# Patient Record
Sex: Female | Born: 1960 | Race: White | Hispanic: No | Smoking: Never smoker
Health system: Southern US, Community
[De-identification: ages and names within clinical notes are randomized; demographics above are authoritative.]

## PROBLEM LIST (undated history)

## (undated) DIAGNOSIS — I10 Essential (primary) hypertension: Secondary | ICD-10-CM

## (undated) HISTORY — DX: Essential (primary) hypertension: I10

---

## 1998-04-11 ENCOUNTER — Other Ambulatory Visit: Admission: RE | Admit: 1998-04-11 | Discharge: 1998-04-11 | Payer: Self-pay | Admitting: Obstetrics and Gynecology

## 2000-04-23 ENCOUNTER — Encounter: Payer: Self-pay | Admitting: Family Medicine

## 2000-04-23 ENCOUNTER — Encounter: Admission: RE | Admit: 2000-04-23 | Discharge: 2000-04-23 | Payer: Self-pay | Admitting: Family Medicine

## 2001-10-14 ENCOUNTER — Encounter: Payer: Self-pay | Admitting: Family Medicine

## 2001-10-14 ENCOUNTER — Encounter: Admission: RE | Admit: 2001-10-14 | Discharge: 2001-10-14 | Payer: Self-pay | Admitting: Family Medicine

## 2002-11-01 ENCOUNTER — Other Ambulatory Visit: Admission: RE | Admit: 2002-11-01 | Discharge: 2002-11-01 | Payer: Self-pay | Admitting: *Deleted

## 2003-08-19 ENCOUNTER — Emergency Department (HOSPITAL_COMMUNITY): Admission: EM | Admit: 2003-08-19 | Discharge: 2003-08-19 | Payer: Self-pay | Admitting: Emergency Medicine

## 2009-11-05 ENCOUNTER — Other Ambulatory Visit: Admission: RE | Admit: 2009-11-05 | Discharge: 2009-11-05 | Payer: Self-pay | Admitting: Family Medicine

## 2010-02-11 ENCOUNTER — Ambulatory Visit (HOSPITAL_COMMUNITY)
Admission: RE | Admit: 2010-02-11 | Discharge: 2010-02-11 | Payer: Self-pay | Source: Home / Self Care | Attending: Obstetrics and Gynecology | Admitting: Obstetrics and Gynecology

## 2010-02-18 LAB — CBC
HCT: 28.4 % — ABNORMAL LOW (ref 36.0–46.0)
Hemoglobin: 8.7 g/dL — ABNORMAL LOW (ref 12.0–15.0)
MCH: 23.8 pg — ABNORMAL LOW (ref 26.0–34.0)
MCHC: 30.6 g/dL (ref 30.0–36.0)
MCV: 77.6 fL — ABNORMAL LOW (ref 78.0–100.0)
Platelets: 289 10*3/uL (ref 150–400)
RBC: 3.66 MIL/uL — ABNORMAL LOW (ref 3.87–5.11)
RDW: 14.5 % (ref 11.5–15.5)
WBC: 6.5 10*3/uL (ref 4.0–10.5)

## 2010-12-12 ENCOUNTER — Other Ambulatory Visit: Payer: Self-pay | Admitting: Radiology

## 2010-12-16 ENCOUNTER — Other Ambulatory Visit: Payer: Self-pay | Admitting: Obstetrics and Gynecology

## 2010-12-16 ENCOUNTER — Other Ambulatory Visit (HOSPITAL_COMMUNITY)
Admission: RE | Admit: 2010-12-16 | Discharge: 2010-12-16 | Disposition: A | Discharge status: Home / Self Care | Payer: BC Managed Care – PPO | Source: Ambulatory Visit | Attending: Obstetrics and Gynecology | Admitting: Obstetrics and Gynecology

## 2010-12-16 DIAGNOSIS — Z01419 Encounter for gynecological examination (general) (routine) without abnormal findings: Secondary | ICD-10-CM | POA: Insufficient documentation

## 2014-05-26 ENCOUNTER — Other Ambulatory Visit (HOSPITAL_COMMUNITY)
Admission: RE | Admit: 2014-05-26 | Discharge: 2014-05-26 | Disposition: A | Discharge status: Home / Self Care | Payer: 59 | Source: Ambulatory Visit | Attending: Family Medicine | Admitting: Family Medicine

## 2014-05-26 DIAGNOSIS — Z124 Encounter for screening for malignant neoplasm of cervix: Secondary | ICD-10-CM | POA: Insufficient documentation

## 2014-05-31 ENCOUNTER — Other Ambulatory Visit: Payer: Self-pay | Admitting: Family Medicine

## 2014-06-01 LAB — CYTOLOGY - PAP

## 2015-06-01 ENCOUNTER — Encounter: Payer: Self-pay | Admitting: Podiatry

## 2015-06-01 ENCOUNTER — Ambulatory Visit (INDEPENDENT_AMBULATORY_CARE_PROVIDER_SITE_OTHER): Payer: 59 | Admitting: Podiatry

## 2015-06-01 VITALS — BP 121/80 | HR 76 | Resp 12

## 2015-06-01 DIAGNOSIS — L600 Ingrowing nail: Secondary | ICD-10-CM | POA: Diagnosis not present

## 2015-06-01 NOTE — Progress Notes (Signed)
   Subjective:    Patient ID: Donna Pollard, female    DOB: [birth year], 55 y.o.   MRN: [id number]  HPI  Chief Complaint  Patient presents with  . Ingrown Toenail    ''rt foot great toenail is sore.''  pt stated rt foot medial side of the great toenail is been hurting for about 4 months. Toenail is getting worse especially when putting pressure on it. Tried no treatment.  Review of Systems  Skin: Positive for color change.       Objective:   Physical Exam        Assessment & Plan:

## 2015-06-01 NOTE — Patient Instructions (Addendum)

## 2015-06-04 NOTE — Progress Notes (Signed)
Subjective:     Patient ID: Donna Pollard, female   DOB: 1960-10-30, 55 y.o.   MRN: [id number]  HPI patient presents with a painful ingrown toenail the right big toe medial border that makes it hard to wear shoe gear comfortably and stated she did have some previous drainage and redness   Review of Systems  All other systems reviewed and are negative.      Objective:   Physical Exam  Constitutional: She is oriented to person, place, and time.  Cardiovascular: Intact distal pulses.   Musculoskeletal: Normal range of motion.  Neurological: She is oriented to person, place, and time.  Skin: Skin is warm.  Nursing note and vitals reviewed.  neurovascular status intact muscle strength adequate range of motion within normal limits with patient found to have incurvated medial border right hallux that's red and painful when palpated. Patient states that it makes it hard to wear shoe gear comfortably     Assessment:     Ingrown toenail deformity right hallux medial border with pain    Plan:     H&P conditions reviewed and discussed treatment options. Due to long-standing nature permanent correction with surgical procedure is been recommended and she wants this understanding risk. I went ahead today and I infiltrated the right hallux 60 mg Xylocaine Marcaine mixture remove the medial border exposed matrix chemical phenol 3 applications 30 seconds followed by alcohol lavage and sterile dressing. Gave instructions on soaks and reappoint

## 2015-06-13 ENCOUNTER — Telehealth: Payer: Self-pay | Admitting: *Deleted

## 2015-06-13 NOTE — Telephone Encounter (Signed)
Called patient at [phone] (Home #) to check to see how they were doing from their ingrown toenail procedure that was performed on Friday, June 01, 2015. Pt stated, "She is feeling fine, but has yellow-green colored ;pus coming out of toe". I told patient that I wanted her to come in to see Dr. Paulla Dolly to have toe checked. I transferred pt over to [facility] in scheduling to see if pt could come in tomorrow (06/14/15) to be seen.

## 2015-06-14 ENCOUNTER — Ambulatory Visit (INDEPENDENT_AMBULATORY_CARE_PROVIDER_SITE_OTHER): Payer: 59 | Admitting: Podiatry

## 2015-06-14 ENCOUNTER — Encounter: Payer: Self-pay | Admitting: Podiatry

## 2015-06-14 DIAGNOSIS — L600 Ingrowing nail: Secondary | ICD-10-CM

## 2015-06-14 NOTE — Progress Notes (Signed)
Subjective:     Patient ID: Donna Pollard, female   DOB: 1961/01/29, 55 y.o.   MRN: [id number]  HPI patient presents stating I just wanted to get this toenail checked   Review of Systems     Objective:   Physical Exam Neurovascular status intact with well-healed surgical site right hallux with slight bit of drainage but localized with no erythema edema or minimal pain    Assessment:     Doing well from ingrown toenail removal right    Plan:     Explained is normal to have a slight bit of irritation and low grade redness and it should go away gradually and if he gets worse we will start her on an antibiotic

## 2016-03-14 DIAGNOSIS — J069 Acute upper respiratory infection, unspecified: Secondary | ICD-10-CM | POA: Diagnosis not present

## 2016-04-03 DIAGNOSIS — H6983 Other specified disorders of Eustachian tube, bilateral: Secondary | ICD-10-CM | POA: Diagnosis not present

## 2016-04-24 ENCOUNTER — Other Ambulatory Visit: Payer: Self-pay | Admitting: Family Medicine

## 2016-04-24 DIAGNOSIS — Z1231 Encounter for screening mammogram for malignant neoplasm of breast: Secondary | ICD-10-CM

## 2016-05-22 ENCOUNTER — Ambulatory Visit
Admission: RE | Admit: 2016-05-22 | Discharge: 2016-05-22 | Disposition: A | Discharge status: Home / Self Care | Payer: 59 | Source: Ambulatory Visit | Attending: Family Medicine | Admitting: Family Medicine

## 2016-05-22 DIAGNOSIS — Z1231 Encounter for screening mammogram for malignant neoplasm of breast: Secondary | ICD-10-CM

## 2016-07-03 DIAGNOSIS — Z0000 Encounter for general adult medical examination without abnormal findings: Secondary | ICD-10-CM | POA: Diagnosis not present

## 2016-07-03 DIAGNOSIS — R7303 Prediabetes: Secondary | ICD-10-CM | POA: Diagnosis not present

## 2016-07-03 DIAGNOSIS — E7800 Pure hypercholesterolemia, unspecified: Secondary | ICD-10-CM | POA: Diagnosis not present

## 2016-07-03 DIAGNOSIS — I10 Essential (primary) hypertension: Secondary | ICD-10-CM | POA: Diagnosis not present

## 2016-12-29 DIAGNOSIS — E7800 Pure hypercholesterolemia, unspecified: Secondary | ICD-10-CM | POA: Diagnosis not present

## 2016-12-29 DIAGNOSIS — Z23 Encounter for immunization: Secondary | ICD-10-CM | POA: Diagnosis not present

## 2016-12-29 DIAGNOSIS — R7303 Prediabetes: Secondary | ICD-10-CM | POA: Diagnosis not present

## 2016-12-29 DIAGNOSIS — I10 Essential (primary) hypertension: Secondary | ICD-10-CM | POA: Diagnosis not present

## 2017-04-17 DIAGNOSIS — M62830 Muscle spasm of back: Secondary | ICD-10-CM | POA: Diagnosis not present

## 2017-04-28 DIAGNOSIS — L718 Other rosacea: Secondary | ICD-10-CM | POA: Diagnosis not present

## 2017-04-28 DIAGNOSIS — L570 Actinic keratosis: Secondary | ICD-10-CM | POA: Diagnosis not present

## 2017-04-28 DIAGNOSIS — I788 Other diseases of capillaries: Secondary | ICD-10-CM | POA: Diagnosis not present

## 2017-05-04 DIAGNOSIS — J029 Acute pharyngitis, unspecified: Secondary | ICD-10-CM | POA: Diagnosis not present

## 2017-05-08 DIAGNOSIS — Z719 Counseling, unspecified: Secondary | ICD-10-CM | POA: Diagnosis not present

## 2017-05-20 DIAGNOSIS — Z719 Counseling, unspecified: Secondary | ICD-10-CM | POA: Diagnosis not present

## 2017-05-24 DIAGNOSIS — N390 Urinary tract infection, site not specified: Secondary | ICD-10-CM | POA: Diagnosis not present

## 2017-05-26 DIAGNOSIS — N390 Urinary tract infection, site not specified: Secondary | ICD-10-CM | POA: Diagnosis not present

## 2017-06-03 DIAGNOSIS — Z719 Counseling, unspecified: Secondary | ICD-10-CM | POA: Diagnosis not present

## 2017-06-04 DIAGNOSIS — R399 Unspecified symptoms and signs involving the genitourinary system: Secondary | ICD-10-CM | POA: Diagnosis not present

## 2017-06-10 DIAGNOSIS — Z719 Counseling, unspecified: Secondary | ICD-10-CM | POA: Diagnosis not present

## 2017-07-16 DIAGNOSIS — T17208A Unspecified foreign body in pharynx causing other injury, initial encounter: Secondary | ICD-10-CM | POA: Diagnosis not present

## 2017-11-20 DIAGNOSIS — Z23 Encounter for immunization: Secondary | ICD-10-CM | POA: Diagnosis not present

## 2017-12-10 DIAGNOSIS — D171 Benign lipomatous neoplasm of skin and subcutaneous tissue of trunk: Secondary | ICD-10-CM | POA: Diagnosis not present

## 2017-12-10 DIAGNOSIS — L814 Other melanin hyperpigmentation: Secondary | ICD-10-CM | POA: Diagnosis not present

## 2017-12-10 DIAGNOSIS — D225 Melanocytic nevi of trunk: Secondary | ICD-10-CM | POA: Diagnosis not present

## 2017-12-30 ENCOUNTER — Other Ambulatory Visit (HOSPITAL_COMMUNITY)
Admission: RE | Admit: 2017-12-30 | Discharge: 2017-12-30 | Disposition: A | Discharge status: Home / Self Care | Payer: 59 | Source: Ambulatory Visit | Attending: Family Medicine | Admitting: Family Medicine

## 2017-12-30 ENCOUNTER — Other Ambulatory Visit: Payer: Self-pay | Admitting: Family Medicine

## 2017-12-30 DIAGNOSIS — Z0000 Encounter for general adult medical examination without abnormal findings: Secondary | ICD-10-CM | POA: Diagnosis not present

## 2017-12-30 DIAGNOSIS — Z01411 Encounter for gynecological examination (general) (routine) with abnormal findings: Secondary | ICD-10-CM | POA: Diagnosis present

## 2017-12-30 DIAGNOSIS — I10 Essential (primary) hypertension: Secondary | ICD-10-CM | POA: Diagnosis not present

## 2017-12-30 DIAGNOSIS — E7800 Pure hypercholesterolemia, unspecified: Secondary | ICD-10-CM | POA: Diagnosis not present

## 2017-12-30 DIAGNOSIS — R7303 Prediabetes: Secondary | ICD-10-CM | POA: Diagnosis not present

## 2018-01-04 LAB — CYTOLOGY - PAP: Diagnosis: NEGATIVE

## 2018-02-12 DIAGNOSIS — L821 Other seborrheic keratosis: Secondary | ICD-10-CM | POA: Diagnosis not present

## 2018-02-12 DIAGNOSIS — L245 Irritant contact dermatitis due to other chemical products: Secondary | ICD-10-CM | POA: Diagnosis not present

## 2019-01-09 IMAGING — MG 2D DIGITAL SCREENING BILATERAL MAMMOGRAM WITH CAD AND ADJUNCT TO
8 of 12 series · 8 of 28 positions shown · non-contrast
Comparison: Previous exam(s).

CLINICAL DATA: Screening.

EXAM:
2D DIGITAL SCREENING BILATERAL MAMMOGRAM WITH CAD AND ADJUNCT TOMO

[L MLO synth-2D]
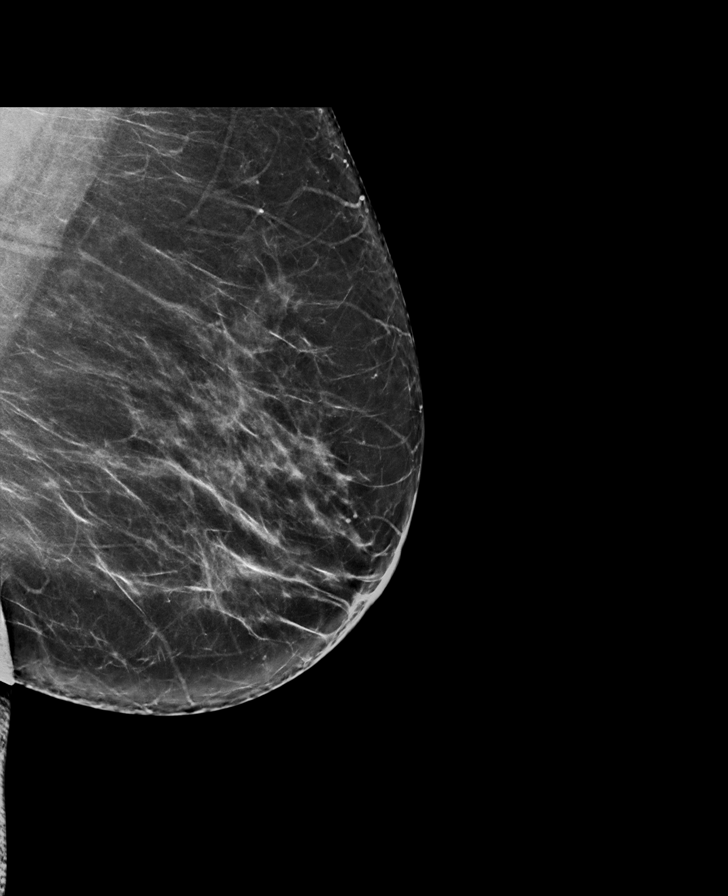

[L CC synth-2D]
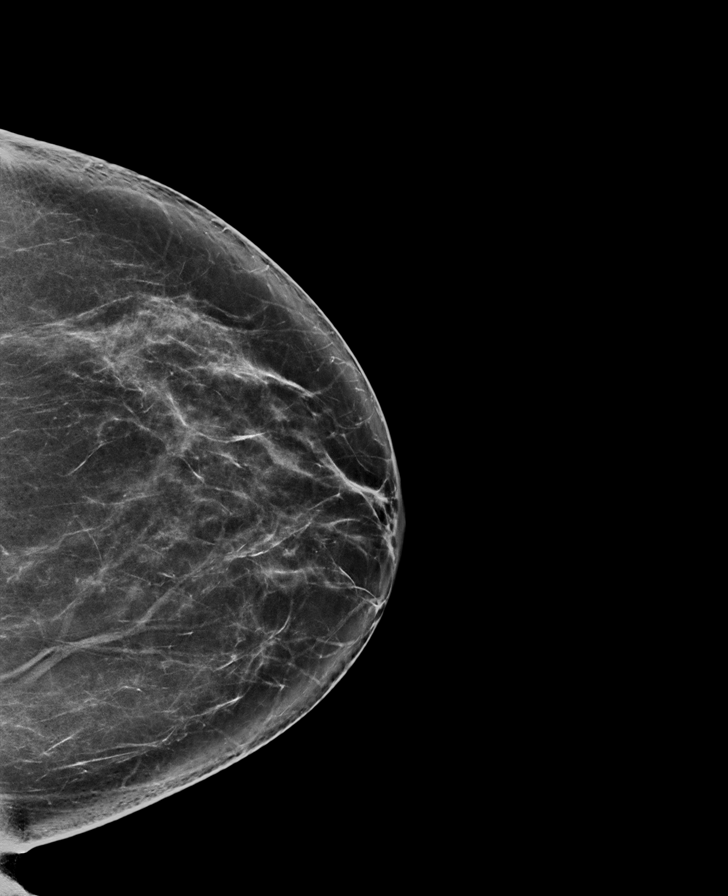

[R MLO synth-2D]
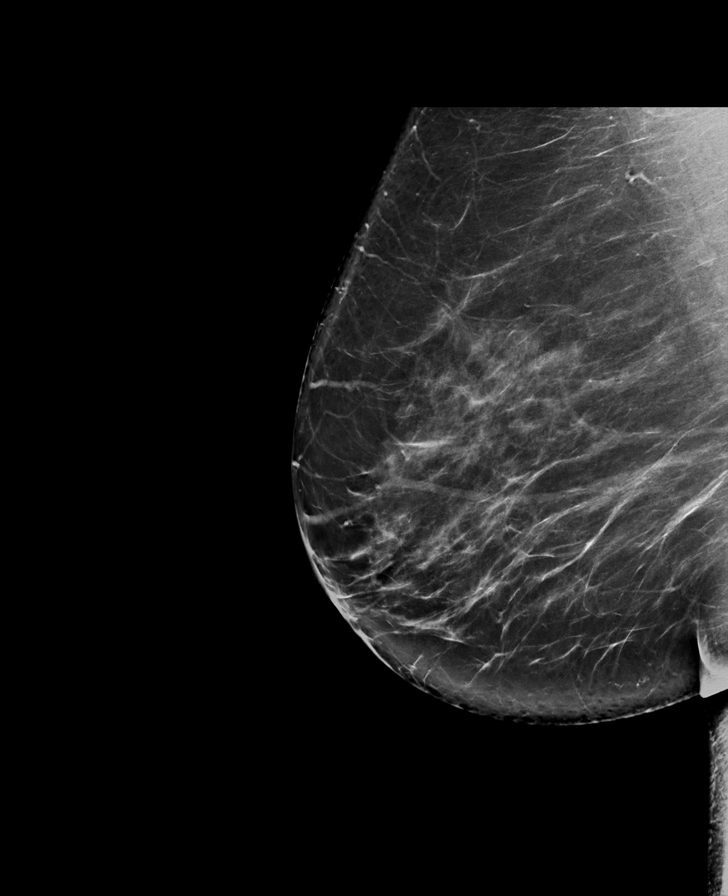

[L CC]
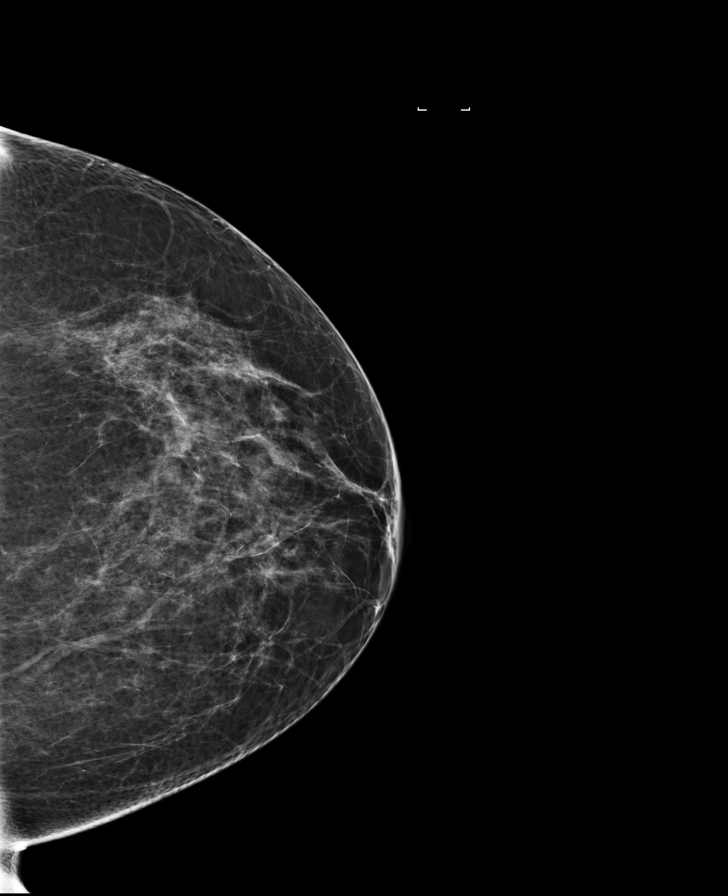

[R CC synth-2D]
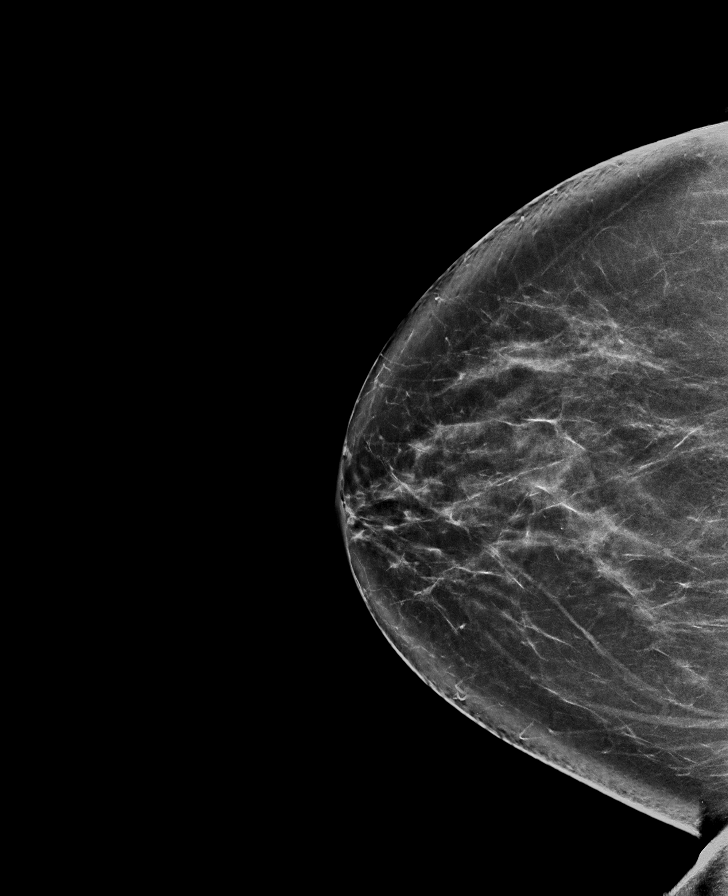

[R CC]
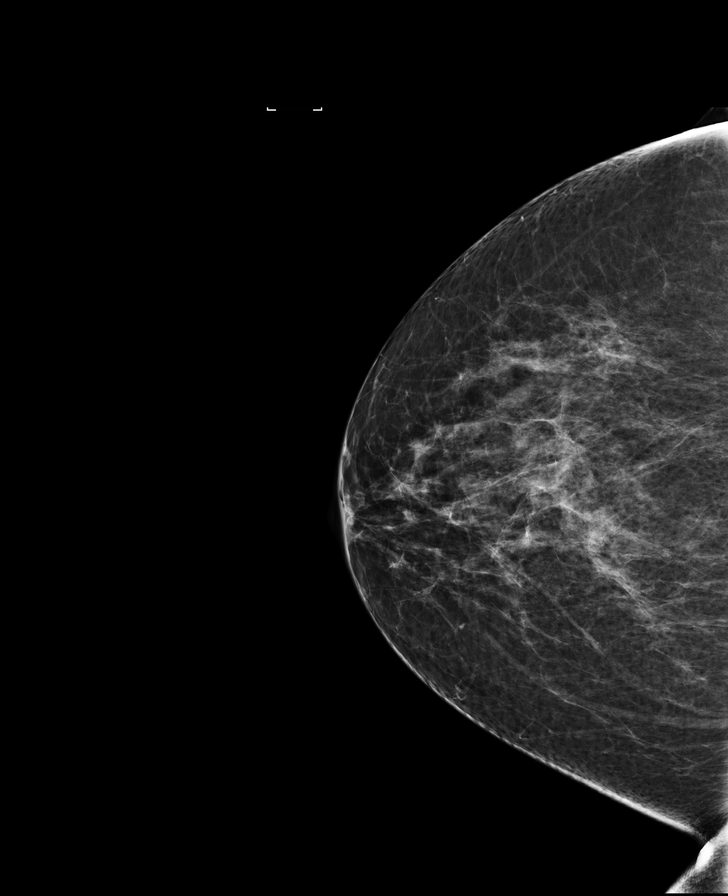

[R MLO]
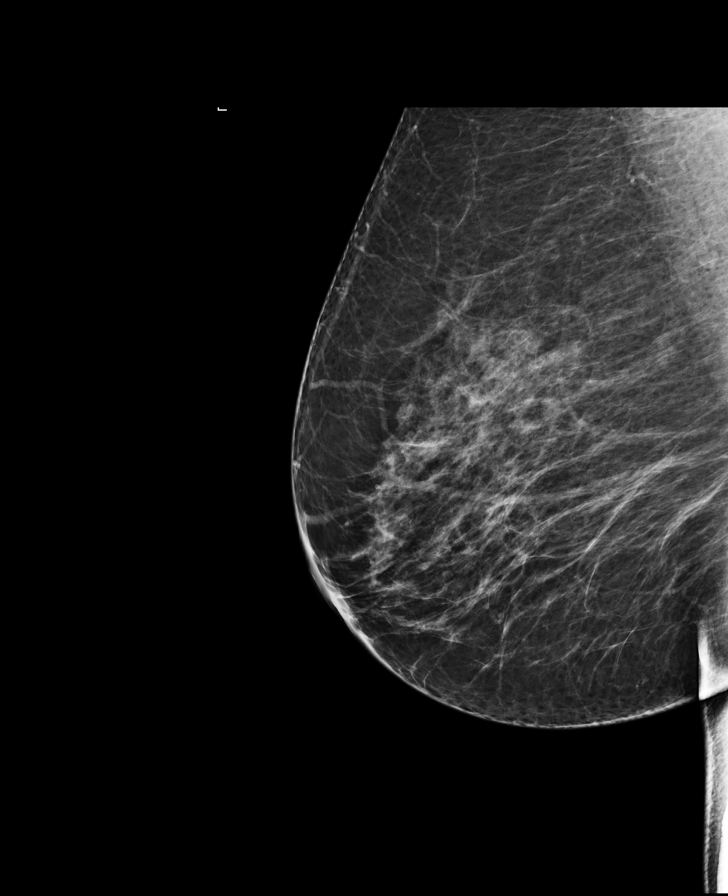

[L MLO]
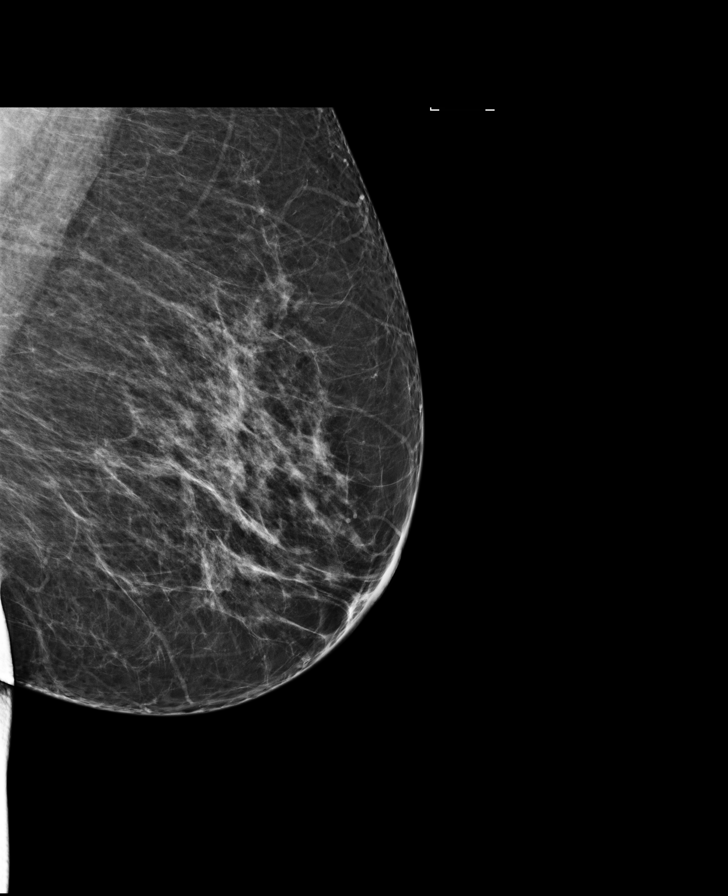

[8 of 28 positions shown; findings below may reference images not displayed]

ACR Breast Density Category b: There are scattered areas of
fibroglandular density.
FINDINGS: There are no findings suspicious for malignancy. Images were
processed with CAD.
IMPRESSION: No mammographic evidence of malignancy. A result letter of this
screening mammogram will be mailed directly to the patient.

RECOMMENDATION:
Screening mammogram in one year. (Code:97-6-RS4)

BI-RADS CATEGORY  1: Negative.

## 2020-10-05 DIAGNOSIS — R202 Paresthesia of skin: Secondary | ICD-10-CM | POA: Diagnosis not present

## 2020-10-22 DIAGNOSIS — D2111 Benign neoplasm of connective and other soft tissue of right upper limb, including shoulder: Secondary | ICD-10-CM | POA: Diagnosis not present

## 2020-10-22 DIAGNOSIS — L821 Other seborrheic keratosis: Secondary | ICD-10-CM | POA: Diagnosis not present

## 2020-10-22 DIAGNOSIS — L718 Other rosacea: Secondary | ICD-10-CM | POA: Diagnosis not present

## 2020-10-28 DIAGNOSIS — G4733 Obstructive sleep apnea (adult) (pediatric): Secondary | ICD-10-CM | POA: Diagnosis not present

## 2020-11-27 DIAGNOSIS — G4733 Obstructive sleep apnea (adult) (pediatric): Secondary | ICD-10-CM | POA: Diagnosis not present

## 2020-12-28 DIAGNOSIS — G4733 Obstructive sleep apnea (adult) (pediatric): Secondary | ICD-10-CM | POA: Diagnosis not present

## 2021-01-27 DIAGNOSIS — G4733 Obstructive sleep apnea (adult) (pediatric): Secondary | ICD-10-CM | POA: Diagnosis not present

## 2021-02-27 DIAGNOSIS — G4733 Obstructive sleep apnea (adult) (pediatric): Secondary | ICD-10-CM | POA: Diagnosis not present

## 2021-03-30 DIAGNOSIS — G4733 Obstructive sleep apnea (adult) (pediatric): Secondary | ICD-10-CM | POA: Diagnosis not present

## 2021-09-11 ENCOUNTER — Other Ambulatory Visit: Payer: Self-pay | Admitting: Family Medicine

## 2021-09-11 ENCOUNTER — Other Ambulatory Visit (HOSPITAL_COMMUNITY)
Admission: RE | Admit: 2021-09-11 | Discharge: 2021-09-11 | Disposition: A | Discharge status: Home / Self Care | Payer: 59 | Source: Ambulatory Visit | Attending: Family Medicine | Admitting: Family Medicine

## 2021-09-11 DIAGNOSIS — Z0000 Encounter for general adult medical examination without abnormal findings: Secondary | ICD-10-CM | POA: Diagnosis not present

## 2021-09-11 DIAGNOSIS — Z01411 Encounter for gynecological examination (general) (routine) with abnormal findings: Secondary | ICD-10-CM | POA: Insufficient documentation

## 2021-09-13 DIAGNOSIS — I10 Essential (primary) hypertension: Secondary | ICD-10-CM | POA: Diagnosis not present

## 2021-09-13 DIAGNOSIS — E7800 Pure hypercholesterolemia, unspecified: Secondary | ICD-10-CM | POA: Diagnosis not present

## 2021-09-13 DIAGNOSIS — R7303 Prediabetes: Secondary | ICD-10-CM | POA: Diagnosis not present

## 2021-09-13 DIAGNOSIS — Z0000 Encounter for general adult medical examination without abnormal findings: Secondary | ICD-10-CM | POA: Diagnosis not present

## 2021-09-18 LAB — CYTOLOGY - PAP
Adequacy: ABSENT
Comment: NEGATIVE
High risk HPV: NEGATIVE

## 2022-11-13 ENCOUNTER — Other Ambulatory Visit: Payer: Self-pay | Admitting: Nurse Practitioner

## 2022-11-13 ENCOUNTER — Other Ambulatory Visit: Payer: Self-pay | Admitting: Family Medicine

## 2022-11-13 ENCOUNTER — Other Ambulatory Visit (HOSPITAL_COMMUNITY)
Admission: RE | Admit: 2022-11-13 | Discharge: 2022-11-13 | Disposition: A | Discharge status: Home / Self Care | Payer: BC Managed Care – PPO | Source: Ambulatory Visit | Attending: Nurse Practitioner | Admitting: Nurse Practitioner

## 2022-11-13 DIAGNOSIS — Z1231 Encounter for screening mammogram for malignant neoplasm of breast: Secondary | ICD-10-CM

## 2022-11-13 DIAGNOSIS — Z124 Encounter for screening for malignant neoplasm of cervix: Secondary | ICD-10-CM | POA: Diagnosis present

## 2022-11-17 LAB — CYTOLOGY - PAP
Comment: NEGATIVE
High risk HPV: NEGATIVE

## 2022-11-27 ENCOUNTER — Encounter: Payer: Self-pay | Admitting: Radiology

## 2022-11-27 ENCOUNTER — Ambulatory Visit
Admission: RE | Admit: 2022-11-27 | Discharge: 2022-11-27 | Disposition: A | Discharge status: Home / Self Care | Payer: Self-pay | Source: Ambulatory Visit | Attending: Family Medicine | Admitting: Family Medicine

## 2022-11-27 DIAGNOSIS — Z1231 Encounter for screening mammogram for malignant neoplasm of breast: Secondary | ICD-10-CM

## 2023-09-11 DIAGNOSIS — R0602 Shortness of breath: Secondary | ICD-10-CM | POA: Diagnosis not present

## 2023-09-11 DIAGNOSIS — E7800 Pure hypercholesterolemia, unspecified: Secondary | ICD-10-CM | POA: Diagnosis not present

## 2023-09-15 ENCOUNTER — Other Ambulatory Visit (HOSPITAL_BASED_OUTPATIENT_CLINIC_OR_DEPARTMENT_OTHER): Payer: Self-pay | Admitting: Family Medicine

## 2023-09-15 DIAGNOSIS — E7800 Pure hypercholesterolemia, unspecified: Secondary | ICD-10-CM

## 2023-09-21 ENCOUNTER — Other Ambulatory Visit (HOSPITAL_COMMUNITY): Payer: Self-pay | Admitting: Family Medicine

## 2023-09-21 DIAGNOSIS — R0602 Shortness of breath: Secondary | ICD-10-CM

## 2023-09-24 DIAGNOSIS — Z23 Encounter for immunization: Secondary | ICD-10-CM | POA: Diagnosis not present

## 2023-09-24 DIAGNOSIS — R7303 Prediabetes: Secondary | ICD-10-CM | POA: Diagnosis not present

## 2023-09-24 DIAGNOSIS — Z0000 Encounter for general adult medical examination without abnormal findings: Secondary | ICD-10-CM | POA: Diagnosis not present

## 2023-09-24 DIAGNOSIS — E7800 Pure hypercholesterolemia, unspecified: Secondary | ICD-10-CM | POA: Diagnosis not present

## 2023-10-26 ENCOUNTER — Ambulatory Visit (HOSPITAL_BASED_OUTPATIENT_CLINIC_OR_DEPARTMENT_OTHER)
Admission: RE | Admit: 2023-10-26 | Discharge: 2023-10-26 | Disposition: A | Discharge status: Home / Self Care | Payer: Self-pay | Source: Ambulatory Visit | Attending: Family Medicine | Admitting: Family Medicine

## 2023-10-26 ENCOUNTER — Ambulatory Visit (HOSPITAL_COMMUNITY)
Admission: RE | Admit: 2023-10-26 | Discharge: 2023-10-26 | Disposition: A | Discharge status: Home / Self Care | Payer: Self-pay | Source: Ambulatory Visit | Attending: Family Medicine | Admitting: Family Medicine

## 2023-10-26 DIAGNOSIS — R0602 Shortness of breath: Secondary | ICD-10-CM | POA: Diagnosis not present

## 2023-10-26 DIAGNOSIS — E7800 Pure hypercholesterolemia, unspecified: Secondary | ICD-10-CM | POA: Insufficient documentation

## 2023-10-26 LAB — ECHOCARDIOGRAM COMPLETE
AR max vel: 1.8 cm2
AV Area VTI: 1.83 cm2
AV Area mean vel: 1.8 cm2
AV Mean grad: 4 mmHg
AV Peak grad: 6.9 mmHg
Ao pk vel: 1.31 m/s
Area-P 1/2: 3.12 cm2
MV M vel: 2.2 m/s
MV Peak grad: 19.4 mmHg
S' Lateral: 2.55 cm

## 2023-12-21 NOTE — Progress Notes (Unsigned)
 Cardiology Office Note:    Date:  12/22/2023   ID:  Donna Pollard, DOB 30-Nov-1960, MRN [id number]  PCP:  Regino Slater, MD  Cardiologist:  None  Electrophysiologist:  None   Referring MD: Regino Slater, MD   Chief Complaint  Patient presents with   Shortness of Breath    History of Present Illness:    Donna Pollard is a 63 y.o. female with a hx of hypertension who is referred by Dr. Regino for evaluation of dyspnea.  Echocardiogram 10/26/2023 showed EF 50 to 55%, normal RV function, no significant valvular disease.  Calcium score 640 (97th percentile) on 10/2023.  She reports she had episode of dyspnea while traveling over the summer.  She was at high elevation.  Started having dyspnea and coughing.  Episode last about 30 minutes.  Denies any chest pain.  Does report since that time she been having shortness of breath when she goes on walks.  Denies any exertional chest pain.  She denies any lightheadedness, syncope, lower extremity edema.  Reports rare palpitations.  No smoking history.  Family's includes father had CABG in her late 62s and paternal uncle died of MI at 38.    Past Medical History:  Diagnosis Date   Hypertension     No past surgical history on file.  Current Medications: Current Meds  Medication Sig   aspirin EC 81 MG tablet Take 1 tablet (81 mg total) by mouth daily. Swallow whole.   atenolol (TENORMIN) 50 MG tablet Take 50 mg by mouth daily.   minocycline (DYNACIN) 50 MG tablet Take 50 mg by mouth daily.   rosuvastatin (CRESTOR) 20 MG tablet Take 1 tablet (20 mg total) by mouth daily.   [DISCONTINUED] atenolol (TENORMIN) 50 MG tablet Take 1 tablet by mouth daily.   [DISCONTINUED] pravastatin (PRAVACHOL) 20 MG tablet Take 20 mg by mouth daily.     Allergies:   Codeine   Social History   Socioeconomic History   Marital status: Divorced    Spouse name: Not on file   Number of children: Not on file   Years of education: Not on file   Highest  education level: Not on file  Occupational History   Not on file  Tobacco Use   Smoking status: Never   Smokeless tobacco: Not on file  Substance and Sexual Activity   Alcohol use: No   Drug use: No   Sexual activity: Not on file  Other Topics Concern   Not on file  Social History Narrative   Not on file   Social Drivers of Health   Financial Resource Strain: Not on file  Food Insecurity: Not on file  Transportation Needs: Not on file  Physical Activity: Not on file  Stress: Not on file  Social Connections: Not on file     Family History: The patient's family history includes Breast cancer in her sister.  ROS:   Please see the history of present illness.     All other systems reviewed and are negative.  EKGs/Labs/Other Studies Reviewed:    The following studies were reviewed today:   EKG:   12/22/2023: Sinus bradycardia, rate 58, low voltage  Recent Labs: No results found for requested labs within last 365 days.  Recent Lipid Panel No results found for: CHOL, TRIG, HDL, CHOLHDL, VLDL, LDLCALC, LDLDIRECT  Physical Exam:    VS:  BP 112/60 (BP Location: Left Arm, Patient Position: Sitting, Cuff Size: Large)   Pulse (!) 58  Ht 5' 1 (1.549 m)   Wt 178 lb 4.8 oz (80.9 kg)   LMP  (LMP Unknown)   SpO2 94%   BMI 33.69 kg/m     Wt Readings from Last 3 Encounters:  12/22/23 178 lb 4.8 oz (80.9 kg)     GEN:  Well nourished, well developed in no acute distress HEENT: Normal NECK: No JVD; No carotid bruits LYMPHATICS: No lymphadenopathy CARDIAC: RRR, no murmurs, rubs, gallops RESPIRATORY:  Clear to auscultation without rales, wheezing or rhonchi  ABDOMEN: Soft, non-tender, non-distended MUSCULOSKELETAL:  No edema; No deformity  SKIN: Warm and dry NEUROLOGIC:  Alert and oriented x 3 PSYCHIATRIC:  Normal affect   ASSESSMENT:    1. Shortness of breath   2. Coronary artery disease involving native coronary artery of native heart, unspecified  whether angina present   3. Essential hypertension   4. Hyperlipidemia, unspecified hyperlipidemia type    PLAN:    CAD: Calcium score 640 (97th percentile) on 10/2023. Echocardiogram 10/26/2023 showed EF 50 to 55%, normal RV function, no significant valvular disease.  Denies any chest pain but is reporting dyspnea, which may represent anginal equivalent - Recommended stress PET for further evaluation.  Her preference is to avoid Lexiscan as she reports sensitivity to medications.  Will instead do exercise Myoview, but discussed that Lexiscan would be backup if unable to reach target heart rate with exercise.  She is agreeable to this - Start aspirin 81 mg daily - Recommend rosuvastatin 20 mg daily.  Check fasting lipid panel in 3 months  Hypertension: On atenolol 50 mg daily.  Appears controlled  Hyperlipidemia: LDL 176 on 09/24/2023.  Start rosuvastatin 20 mg daily.  Check fasting lipid panel in 3 months  RTC in 3 months  Informed Consent   Shared Decision Making/Informed Consent The risks [chest pain, shortness of breath, cardiac arrhythmias, dizziness, blood pressure fluctuations, myocardial infarction, stroke/transient ischemic attack, nausea, vomiting, allergic reaction, radiation exposure, metallic taste sensation and life-threatening complications (estimated to be 1 in 10,000)], benefits (risk stratification, diagnosing coronary artery disease, treatment guidance) and alternatives of a nuclear stress test were discussed in detail with Ms. Sickman and she agrees to proceed.       Medication Adjustments/Labs and Tests Ordered: Current medicines are reviewed at length with the patient today.  Concerns regarding medicines are outlined above.  Orders Placed This Encounter  Procedures   Lipid Profile   Cardiac Stress Test: Informed Consent Details: Physician/Practitioner Attestation; Transcribe to consent form and obtain patient signature   Myocardial Perfusion Imaging   EKG 12-Lead    Meds ordered this encounter  Medications   aspirin EC 81 MG tablet    Sig: Take 1 tablet (81 mg total) by mouth daily. Swallow whole.   rosuvastatin (CRESTOR) 20 MG tablet    Sig: Take 1 tablet (20 mg total) by mouth daily.    Dispense:  90 tablet    Refill:  3    Patient Instructions  Medication Instructions:   START Aspirin EC one (1) tablet by mouth ( 81 mg) daily.   START Rosuvastatin one (1) tablet by mouth ( 20 mg) daily.   *If you need a refill on your cardiac medications before your next appointment, please call your pharmacy*  Lab Work:  Your physician recommends that you return for a FASTING lipid profile in 3 months, fasting after midnight. Patient given paperwork today.    If you have labs (blood work) drawn today and your tests are completely normal,  you will receive your results only by: MyChart Message (if you have MyChart) OR A paper copy in the mail If you have any lab test that is abnormal or we need to change your treatment, we will call you to review the results.  Testing/Procedures:  You are scheduled for a Myocardial Perfusion Imaging Study on                 at                     .   Please arrive 15 minutes prior to your appointment time for registration and insurance purposes.   The test will take approximately 3 to 4 hours to complete; you may bring reading material. If someone comes with you to your appointment, they will need to remain in the main lobby due to limited space in the testing area.    How to prepare for your Myocardial Perfusion test:   Do not eat or drink 3 hours prior to your test, except you may have water.    Do not consume products containing caffeine (regular or decaffeinated) 12 hours prior to your test (ex: coffee, chocolate, soda, tea)   Do bring a list of your current medications with you. If not listed below, you may take your medications as normal.    Do not take Atenolol the am  prior to the test.   Bring any  held medication to your appointment, as you may be required to take it once the test is complete.   Do wear comfortable clothes (no dresses ) and walking shoes. Tennis shoes are preferred. No heels or open toed shoes.  Do not wear perfume, aftershave or lotions (deodorant is allowed).   If these instructions are not followed, you test will have to be rescheduled.   Please report to [address] for your test. If you have questions or concerns about your appointment, please call the Nuclear Lab at #[phone].  If you cannot keep your appointment, please provide 24 hour notification to the Nuclear lab to avoid a possible $50 charge to your account.      Follow-Up: At [facility], you and your health needs are our priority.  As part of our continuing mission to provide you with exceptional heart care, our providers are all part of one team.  This team includes your primary Cardiologist (physician) and Advanced Practice Providers or APPs (Physician Assistants and Nurse Practitioners) who all work together to provide you with the care you need, when you need it.  Your next appointment:   3 month(s)  Provider:   Lonni Nanas, MD    We recommend signing up for the patient portal called MyChart.  Sign up information is provided on this After Visit Summary.  MyChart is used to connect with patients for Virtual Visits (Telemedicine).  Patients are able to view lab/test results, encounter notes, upcoming appointments, etc.  Non-urgent messages can be sent to your provider as well.   To learn more about what you can do with MyChart, go to [email or web address].             Signed, Lonni LITTIE Nanas, MD  12/22/2023 1:32 PM     [facility]

## 2023-12-22 ENCOUNTER — Ambulatory Visit (HOSPITAL_BASED_OUTPATIENT_CLINIC_OR_DEPARTMENT_OTHER): Admitting: Cardiology

## 2023-12-22 ENCOUNTER — Encounter (HOSPITAL_BASED_OUTPATIENT_CLINIC_OR_DEPARTMENT_OTHER): Payer: Self-pay | Admitting: Cardiology

## 2023-12-22 ENCOUNTER — Telehealth (HOSPITAL_COMMUNITY): Payer: Self-pay | Admitting: *Deleted

## 2023-12-22 VITALS — BP 112/60 | HR 58 | Ht 61.0 in | Wt 178.3 lb

## 2023-12-22 DIAGNOSIS — I2510 Atherosclerotic heart disease of native coronary artery without angina pectoris: Secondary | ICD-10-CM | POA: Diagnosis not present

## 2023-12-22 DIAGNOSIS — E785 Hyperlipidemia, unspecified: Secondary | ICD-10-CM

## 2023-12-22 DIAGNOSIS — I10 Essential (primary) hypertension: Secondary | ICD-10-CM | POA: Diagnosis not present

## 2023-12-22 DIAGNOSIS — R87612 Low grade squamous intraepithelial lesion on cytologic smear of cervix (LGSIL): Secondary | ICD-10-CM | POA: Diagnosis not present

## 2023-12-22 DIAGNOSIS — L718 Other rosacea: Secondary | ICD-10-CM | POA: Diagnosis not present

## 2023-12-22 DIAGNOSIS — Z780 Asymptomatic menopausal state: Secondary | ICD-10-CM | POA: Diagnosis not present

## 2023-12-22 DIAGNOSIS — Z113 Encounter for screening for infections with a predominantly sexual mode of transmission: Secondary | ICD-10-CM | POA: Diagnosis not present

## 2023-12-22 DIAGNOSIS — R0602 Shortness of breath: Secondary | ICD-10-CM | POA: Diagnosis not present

## 2023-12-22 DIAGNOSIS — D0439 Carcinoma in situ of skin of other parts of face: Secondary | ICD-10-CM | POA: Diagnosis not present

## 2023-12-22 DIAGNOSIS — D045 Carcinoma in situ of skin of trunk: Secondary | ICD-10-CM | POA: Diagnosis not present

## 2023-12-22 DIAGNOSIS — Z01419 Encounter for gynecological examination (general) (routine) without abnormal findings: Secondary | ICD-10-CM | POA: Diagnosis not present

## 2023-12-22 DIAGNOSIS — N644 Mastodynia: Secondary | ICD-10-CM | POA: Diagnosis not present

## 2023-12-22 MED ORDER — ROSUVASTATIN CALCIUM 20 MG PO TABS: 20.0000 mg | ORAL_TABLET | Freq: Every day | ORAL | 3 refills | Status: AC

## 2023-12-22 MED ORDER — ASPIRIN 81 MG PO TBEC: 81.0000 mg | DELAYED_RELEASE_TABLET | Freq: Every day | ORAL | Status: AC

## 2023-12-22 NOTE — Telephone Encounter (Signed)
 Patient given detailed instructions per Myocardial Perfusion Study Information Sheet for the test on 12/29/23 Patient notified to arrive 15 minutes early and that it is imperative to arrive on time for appointment to keep from having the test rescheduled.  If you need to cancel or reschedule your appointment, please call the office within 24 hours of your appointment. . Patient verbalized understanding.Claudene Ronal Quale, RN

## 2023-12-22 NOTE — Patient Instructions (Signed)
 Medication Instructions:   START Aspirin EC one (1) tablet by mouth ( 81 mg) daily.   START Rosuvastatin one (1) tablet by mouth ( 20 mg) daily.   *If you need a refill on your cardiac medications before your next appointment, please call your pharmacy*  Lab Work:  Your physician recommends that you return for a FASTING lipid profile in 3 months, fasting after midnight. Patient given paperwork today.    If you have labs (blood work) drawn today and your tests are completely normal, you will receive your results only by: MyChart Message (if you have MyChart) OR A paper copy in the mail If you have any lab test that is abnormal or we need to change your treatment, we will call you to review the results.  Testing/Procedures:  You are scheduled for a Myocardial Perfusion Imaging Study on                 at                     .   Please arrive 15 minutes prior to your appointment time for registration and insurance purposes.   The test will take approximately 3 to 4 hours to complete; you may bring reading material. If someone comes with you to your appointment, they will need to remain in the main lobby due to limited space in the testing area.    How to prepare for your Myocardial Perfusion test:   Do not eat or drink 3 hours prior to your test, except you may have water.    Do not consume products containing caffeine (regular or decaffeinated) 12 hours prior to your test (ex: coffee, chocolate, soda, tea)   Do bring a list of your current medications with you. If not listed below, you may take your medications as normal.    Do not take Atenolol the am  prior to the test.   Bring any held medication to your appointment, as you may be required to take it once the test is complete.   Do wear comfortable clothes (no dresses ) and walking shoes. Tennis shoes are preferred. No heels or open toed shoes.  Do not wear perfume, aftershave or lotions (deodorant is allowed).   If these  instructions are not followed, you test will have to be rescheduled.   Please report to [address] for your test. If you have questions or concerns about your appointment, please call the Nuclear Lab at #[phone].  If you cannot keep your appointment, please provide 24 hour notification to the Nuclear lab to avoid a possible $50 charge to your account.      Follow-Up: At [facility], you and your health needs are our priority.  As part of our continuing mission to provide you with exceptional heart care, our providers are all part of one team.  This team includes your primary Cardiologist (physician) and Advanced Practice Providers or APPs (Physician Assistants and Nurse Practitioners) who all work together to provide you with the care you need, when you need it.  Your next appointment:   3 month(s)  Provider:   Lonni Nanas, MD    We recommend signing up for the patient portal called MyChart.  Sign up information is provided on this After Visit Summary.  MyChart is used to connect with patients for Virtual Visits (Telemedicine).  Patients are able to view lab/test results, encounter notes, upcoming appointments, etc.  Non-urgent messages  can be sent to your provider as well.   To learn more about what you can do with MyChart, go to [email or web address].

## 2023-12-23 ENCOUNTER — Other Ambulatory Visit (HOSPITAL_BASED_OUTPATIENT_CLINIC_OR_DEPARTMENT_OTHER): Payer: Self-pay | Admitting: Cardiology

## 2023-12-23 DIAGNOSIS — R0602 Shortness of breath: Secondary | ICD-10-CM

## 2023-12-28 ENCOUNTER — Telehealth: Payer: Self-pay | Admitting: Cardiology

## 2023-12-28 NOTE — Telephone Encounter (Signed)
 Pt requesting c/b to set up payment plan. Please advise

## 2023-12-29 ENCOUNTER — Ambulatory Visit: Payer: Self-pay | Admitting: Cardiology

## 2023-12-29 ENCOUNTER — Ambulatory Visit (HOSPITAL_COMMUNITY)
Admission: RE | Admit: 2023-12-29 | Discharge: 2023-12-29 | Disposition: A | Discharge status: Home / Self Care | Source: Ambulatory Visit | Attending: Internal Medicine | Admitting: Internal Medicine

## 2023-12-29 DIAGNOSIS — R0602 Shortness of breath: Secondary | ICD-10-CM | POA: Insufficient documentation

## 2023-12-29 LAB — MYOCARDIAL PERFUSION IMAGING
Angina Index: 0
Base ST Depression (mm): 0 mm
Duke Treadmill Score: 6
Estimated workload: 7
Exercise duration (min): 6 min
LV dias vol: 52 mL (ref 46–106)
LV sys vol: 11 mL (ref 3.8–5.2)
MPHR: 155 {beats}/min
Nuc Stress EF: 79 %
Peak HR: 142 {beats}/min
Percent HR: 91 %
Rest HR: 62 {beats}/min
Rest Nuclear Isotope Dose: 8.5 mCi
SDS: 0
SRS: 0
SSS: 0
ST Depression (mm): 0 mm
Stress Nuclear Isotope Dose: 26.6 mCi
TID: 0.85

## 2023-12-29 MED ORDER — TECHNETIUM TC 99M TETROFOSMIN IV KIT
8.5000 | PACK | Freq: Once | INTRAVENOUS | Status: AC | PRN
  Administered 2023-12-29: 8.5 via INTRAVENOUS

## 2023-12-29 MED ORDER — TECHNETIUM TC 99M TETROFOSMIN IV KIT
26.6000 | PACK | Freq: Once | INTRAVENOUS | Status: AC | PRN
  Administered 2023-12-29: 26.6 via INTRAVENOUS

## 2024-01-21 NOTE — Telephone Encounter (Signed)
 I spoke with patient, went over stress test results

## 2024-04-06 ENCOUNTER — Ambulatory Visit (HOSPITAL_BASED_OUTPATIENT_CLINIC_OR_DEPARTMENT_OTHER): Admitting: Cardiology
